# Patient Record
Sex: Male | Born: 1975
Health system: Southern US, Community
[De-identification: ages and names within clinical notes are randomized; demographics above are authoritative.]

## PROBLEM LIST (undated history)

## (undated) DIAGNOSIS — R29898 Other symptoms and signs involving the musculoskeletal system: Secondary | ICD-10-CM

## (undated) DIAGNOSIS — H538 Other visual disturbances: Secondary | ICD-10-CM

## (undated) HISTORY — PX: OTHER SURGICAL HISTORY: SHX169

## (undated) HISTORY — DX: Other visual disturbances: H53.8

## (undated) HISTORY — DX: Other symptoms and signs involving the musculoskeletal system: R29.898

---

## 2016-06-08 ENCOUNTER — Ambulatory Visit: Payer: 59 | Attending: Neurosurgery | Admitting: Physical Therapy

## 2016-06-08 ENCOUNTER — Encounter: Payer: Self-pay | Admitting: Physical Therapy

## 2016-06-08 DIAGNOSIS — M62838 Other muscle spasm: Secondary | ICD-10-CM | POA: Diagnosis present

## 2016-06-08 DIAGNOSIS — M6281 Muscle weakness (generalized): Secondary | ICD-10-CM | POA: Insufficient documentation

## 2016-06-08 DIAGNOSIS — M542 Cervicalgia: Secondary | ICD-10-CM | POA: Diagnosis present

## 2016-06-08 NOTE — Therapy (Signed)
[facility] [address] [place], [place], [address] Phone: [phone]   Fax:  [phone]  Physical Therapy Evaluation  Patient Details  Name: Alan Buck MRN: [id number] Date of Birth: 23-Apr-1976 Referring Provider: Dr. Maeola Harman  Encounter Date: 06/08/2016      PT End of Session - 06/08/16 0833    Visit Number 1   Date for PT Re-Evaluation 08/03/16   Authorization - Visit Number 1   Authorization - Number of Visits 8   PT Start Time 0800   PT Stop Time 0850   PT Time Calculation (min) 50 min   Activity Tolerance Patient tolerated treatment well   Behavior During Therapy [facility] for tasks assessed/performed      History reviewed. No pertinent past medical history.  History reviewed. No pertinent surgical history.  There were no vitals filed for this visit.       Subjective Assessment - 06/08/16 0804    Subjective Patient reports pain 1 year ago after a long trip from [place].  Tingling in hands and under feet. MD said it was due to his posture. Pain was off and on.  Patient was transferred to [facility] and still had problem.  Patient saw chiropractor and no changes.    Diagnostic tests MRI was negative   Patient Stated Goals reduce pain; educate on posture   Currently in Pain? Yes   Pain Score 4    Pain Location Neck  upper shoulders   Pain Orientation Right;Left;Mid   Pain Descriptors / Indicators Constant   Pain Type Chronic pain   Pain Radiating Towards down to shoulders   Pain Onset More than a month ago   Pain Frequency Constant   Aggravating Factors  driving; works in [place] and increases pain   Pain Relieving Factors movement; walking   Multiple Pain Sites No            [facility] PT Assessment - 06/08/16 0001      Assessment   Medical Diagnosis M54.12 Radiculopathy, cervical region   Referring Provider Dr. Maeola Harman   Onset Date/Surgical Date 06/08/16   Hand Dominance Right    Prior Therapy chiropractor     Precautions   Precautions None     Restrictions   Weight Bearing Restrictions No     Balance Screen   Has the patient fallen in the past 6 months No   Has the patient had a decrease in activity level because of a fear of falling?  No   Is the patient reluctant to leave their home because of a fear of falling?  No     Home [employer]ist information centre manager residence     Prior Function   Level of Independence Independent   Vocation Full time employment   Vocation Requirements sitting   Leisure walk 2 miles per day; yog     Cognition   Overall Cognitive Status Within Functional Limits for tasks assessed     Observation/Other Assessments   Focus on Therapeutic Outcomes (FOTO)  49% limitation  goal is 35% limitation     Posture/Postural Control   Posture/Postural Control Postural limitations   Postural Limitations Rounded Shoulders;Forward head;Flexed trunk     ROM / Strength   AROM / PROM / Strength AROM;Strength     AROM   Overall AROM Comments left rotation thoracic decreased by 25% with pain   AROM Assessment Site Cervical   Cervical Flexion 50   Cervical Extension  70   Cervical - Right Side Bend 40   Cervical - Left Side Bend 40   Cervical - Right Rotation 90   Cervical - Left Rotation 80     Strength   Overall Strength Comments bilateral shoulder horizontal abduction 4/5     Palpation   Spinal mobility decreased movement of C6-T4 vertebrae   Palpation comment tenderness located in bilateral cervical paraspinals, scalenes, upper trap     Special Tests    Special Tests Cervical   Cervical Tests Spurling's     Spurling's   Findings Positive   Side Left   Comment pain on right upper trap     Transfers   Transfers Not assessed     Ambulation/Gait   Ambulation/Gait No                           PT Education - 06/08/16 0831    Education provided Yes   Education Details Scalene stretch, scap  squeezes, red band horizontal abduction with core activation, diagonals, with red band.    Person(s) Educated Patient   Methods Explanation;Demonstration;Tactile cues;Verbal cues   Comprehension Verbalized understanding;Returned demonstration          PT Short Term Goals - 06/08/16 0830      PT SHORT TERM GOAL #1   Title independent with initial HEP including stretches and postural exercises   Time 8   Period Weeks   Status New     PT SHORT TERM GOAL #2   Title pain with driving and turning to the left decreased >/= 50% due to increased cervical and thoracic movement   Time 4   Period Weeks   Status New     PT SHORT TERM GOAL #3   Title sit for 1.5 hours with pain decreased >/= 50% due to improved postural awareness   Time 4   Period Weeks   Status New           PT Long Term Goals - 06/08/16 [id number]      PT LONG TERM GOAL #1   Title independent with HEP   Time 8   Period Weeks   Status New     PT LONG TERM GOAL #2   Title drive with turning head to the left without pain due to increased mobility   Time 8   Period Weeks   Status New     PT LONG TERM GOAL #3   Title sit for 1.5 hours with minimal to no discomfort due to improved postural awareness   Time 8   Period Weeks   Status New     PT LONG TERM GOAL #4   Title pain is minimal to none during daily activities intermittently   Time 8   Period Weeks   Status New     PT LONG TERM GOAL #5   Title FOTO score is </= 35% limitation   Time 8   Period Weeks   Status New               Plan - 06/08/16 0834    Clinical Impression Statement Patient is a 41 year old male with cervical radiculopathy for 1 year.  Pain began after a long flight from [place].  Patient reports constant cervical and upper trapezius pain at level 4/10.  Patient reports pain is worse with sitting and driving while turning his head to the left.  Cervical  and thoracic rotation to  the left decreased by 25% with pain.  Decreased  mobility of C6-T4 vertebrae.  Palpable tenderness located in cervical paraspinals and upper trapezius.  Patient has weak interscapular muscles. Patient is low complexity evaluation and no comorbidities that will affect impact of care.  Patient will benefit from skilled therapy to reduce pain and improve flexibilty.    Rehab Potential Excellent   Clinical Impairments Affecting Rehab Potential None   PT Frequency 1x / week   PT Duration 8 weeks   PT Treatment/Interventions Cryotherapy;Electrical Stimulation;Ultrasound;Traction;Moist Heat;Therapeutic activities;Therapeutic exercise;Neuromuscular re-education;Patient/family education;Passive range of motion;Manual techniques;Dry needling   PT Next Visit Plan soft tissue work, joint mobilization to C6-T4; postural muscle strength; soft tissue work to cervical paraspinals; dry needling to cervical paraspinals and upper trap   PT Home Exercise Plan postural education   Recommended Other Services None   Consulted and Agree with Plan of Care Patient      Patient will benefit from skilled therapeutic intervention in order to improve the following deficits and impairments:  Pain, Decreased strength, Decreased mobility, Decreased activity tolerance, Decreased range of motion  Visit Diagnosis: Muscle weakness (generalized) - Plan: PT plan of care cert/re-cert  Other muscle spasm - Plan: PT plan of care cert/re-cert  Cervicalgia - Plan: PT plan of care cert/re-cert     Problem List There are no active problems to display for this patient.   Eulis Foster, PT 06/08/16 9:19 AM   [facility] [address], [address] [place], [place], [address] Phone: [phone]   Fax:  [phone]  Name: Alan Buck MRN: [id number] Date of Birth: Feb 17, 1976

## 2016-06-08 NOTE — Patient Instructions (Signed)
AROM: Lateral Neck Flexion    Sitting position, hold onto your chair to keep shoulder blades SOFTLY down. Slowly tilt ear toward one shoulder, then the other. Hold each position __3-4__breaths. Repeat __1-3__ times per set. Do _multiple___ sessions per day.  http://orth.exer.us/296   Copyright  VHI. All rights reserved.    PNF Strengthening: Resisted   Standing with resistive band around each hand, bring right arm up and away, thumb back. Repeat _10___ times per set. Do _2___ sets per session. Do _1-2___ sessions per day.      Resisted Horizontal Abduction: Bilateral   Sit or stand, tubing in both hands, arms out in front. Keeping arms straight, pinch shoulder blades together and stretch arms out. Practice relaxing neck muscles and using the muscles between the shoulder blades to move arms.  Repeat _10___ times per set. Do 2____ sets per session. Do _1-2___ sessions per day.                  Scapular Retraction: Elbow Flexion (Standing)   With elbows bent to 90, pinch shoulder blades together and rotate arms out, keeping elbows bent. Repeat _5-10___ times per set. Do many____ sessions per day.

## 2016-06-14 ENCOUNTER — Ambulatory Visit: Payer: 59

## 2016-06-14 DIAGNOSIS — M6281 Muscle weakness (generalized): Secondary | ICD-10-CM | POA: Diagnosis not present

## 2016-06-14 DIAGNOSIS — M542 Cervicalgia: Secondary | ICD-10-CM

## 2016-06-14 DIAGNOSIS — M62838 Other muscle spasm: Secondary | ICD-10-CM

## 2016-06-14 NOTE — Therapy (Signed)
[facility] [address], [address] [place], [place], [address] Phone: [phone]   Fax:  [phone]  Physical Therapy Treatment  Patient Details  Name: Alan Buck MRN: [id number] Date of Birth: 11/12/1975 Referring Provider: Dr. Maeola Harman  Encounter Date: 06/14/2016      PT End of Session - 06/14/16 0806    Visit Number 2   Date for PT Re-Evaluation 08/03/16   Authorization - Visit Number 2   Authorization - Number of Visits 8   PT Start Time 0729   PT Stop Time 0802   PT Time Calculation (min) 33 min   Activity Tolerance Patient tolerated treatment well   Behavior During Therapy [facility] for tasks assessed/performed      History reviewed. No pertinent past medical history.  History reviewed. No pertinent surgical history.  There were no vitals filed for this visit.      Subjective Assessment - 06/14/16 0733    Subjective Pt doesn't want to try dry needling right now.  Pt has been doing exercises.     Diagnostic tests MRI was negative   Patient Stated Goals reduce pain; educate on posture   Currently in Pain? Yes   Pain Score 4    Pain Location Neck   Pain Orientation Right;Left   Pain Descriptors / Indicators Constant   Pain Type Chronic pain   Pain Onset More than a month ago   Pain Frequency Constant   Aggravating Factors  driving, sitting at desk   Pain Relieving Factors movement, walking                          [facility] Adult PT Treatment/Exercise - 06/14/16 0001      Exercises   Exercises Neck;Shoulder;Lumbar     Neck Exercises: Machines for Strengthening   UBE (Upper Arm Bike) Level 1x 6 minutes (3/3)  PT present to discuss progress     Neck Exercises: Supine   Upper Extremity D2 Flexion;Theraband;20 reps   Theraband Level (UE D2) Level 2 (Red)   Other Supine Exercise supine on foam roll for decompression: horizontal abduction with red band 2x10     Lumbar Exercises:  Seated   Other Seated Lumbar Exercises thoraco lumbar rotation: 3x20 seconds     Manual Therapy   Manual Therapy Joint mobilization   Manual therapy comments PA mobs T1-6 grade 2-3 with pt prone     Neck Exercises: Stretches   Upper Trapezius Stretch 3 reps;10 seconds                PT Education - 06/14/16 0806    Education provided Yes   Education Details neck rotation, thoracic rotation   Person(s) Educated Patient   Methods Explanation;Demonstration;Verbal cues   Comprehension Verbalized understanding;Returned demonstration          PT Short Term Goals - 06/14/16 0735      PT SHORT TERM GOAL #1   Title independent with initial HEP including stretches and postural exercises   Time 8   Period Weeks   Status On-going     PT SHORT TERM GOAL #2   Title pain with driving and turning to the left decreased >/= 50% due to increased cervical and thoracic movement   Time 4   Period Weeks   Status On-going     PT SHORT TERM GOAL #3   Title sit for 1.5 hours with pain decreased >/= 50% due to improved postural  awareness   Time 4   Period Weeks   Status On-going           PT Long Term Goals - 06/08/16 [id number]      PT LONG TERM GOAL #1   Title independent with HEP   Time 8   Period Weeks   Status New     PT LONG TERM GOAL #2   Title drive with turning head to the left without pain due to increased mobility   Time 8   Period Weeks   Status New     PT LONG TERM GOAL #3   Title sit for 1.5 hours with minimal to no discomfort due to improved postural awareness   Time 8   Period Weeks   Status New     PT LONG TERM GOAL #4   Title pain is minimal to none during daily activities intermittently   Time 8   Period Weeks   Status New     PT LONG TERM GOAL #5   Title FOTO score is </= 35% limitation   Time 8   Period Weeks   Status New               Plan - 06/14/16 [id number]    Clinical Impression Statement Pt with only one session after evaluation.   Pt has been working on his posture at work and has been advised to take mini breaks when sitting at desk for long periods.  PT focused on postural strength, thoracic and neck flexibility today.  Pt will continue to benefit from skilled PT for cervical flexibility, strength and postural training.     Rehab Potential Excellent   PT Frequency 1x / week   PT Duration 8 weeks   PT Treatment/Interventions Cryotherapy;Electrical Stimulation;Ultrasound;Traction;Moist Heat;Therapeutic activities;Therapeutic exercise;Neuromuscular re-education;Patient/family education;Passive range of motion;Manual techniques;Dry needling   PT Next Visit Plan soft tissue work, joint mobilization to C6-T4; postural muscle strength; soft tissue work to cervical paraspinals   Consulted and Agree with Plan of Care Patient      Patient will benefit from skilled therapeutic intervention in order to improve the following deficits and impairments:  Pain, Decreased strength, Decreased mobility, Decreased activity tolerance, Decreased range of motion  Visit Diagnosis: Muscle weakness (generalized)  Other muscle spasm  Cervicalgia     Problem List There are no active problems to display for this patient.    Lorrene ReidKelly Takacs, PT 06/14/16 8:08 AM  [facility] [address], [address] [place], [place], [address] Phone: [phone]   Fax:  [phone]  Name: Alan Buck MRN: [id number] Date of Birth: 05-14-1976

## 2016-06-14 NOTE — Patient Instructions (Addendum)
Cervico-Thoracic: Extension / Rotation (Sitting)    Reach across body with left arm and grasp back of chair. Gently look over right side shoulder. Hold __20__ seconds. Relax. Repeat _3___ times per set. Do _1___ sets per session. Do __3__ sessions per day.  http://orth.exer.us/980   Copyright  VHI. All rights reserved.   AROM: Neck Rotation    Turn head slowly to look over one shoulder, then the other. Hold each position __20__ seconds. Repeat __3__ times per set. Do _1___ sets per session. Do _3___ sessions per day.  http://orth.exer.us/294   Copyright  VHI. All rights reserved.   [facility] [address], [address] [place], [place] [address] Phone # [phone] Fax [phone]

## 2016-06-21 ENCOUNTER — Ambulatory Visit: Payer: 59 | Attending: Neurosurgery

## 2016-06-21 DIAGNOSIS — M542 Cervicalgia: Secondary | ICD-10-CM | POA: Insufficient documentation

## 2016-06-21 DIAGNOSIS — M62838 Other muscle spasm: Secondary | ICD-10-CM | POA: Insufficient documentation

## 2016-06-21 DIAGNOSIS — M6281 Muscle weakness (generalized): Secondary | ICD-10-CM | POA: Diagnosis present

## 2016-06-21 NOTE — Therapy (Signed)
[facility] [address] [place], [place], [address] Phone: [phone]   Fax:  [phone]  Physical Therapy Treatment  Patient Details  Name: Alan Buck MRN: [id number] Date of Birth: [birth year] Referring Provider: Dr. Maeola HarmanJoseph Stern  Encounter Date: 06/21/2016      PT End of Session - 06/21/16 0804    Visit Number 3   Date for PT Re-Evaluation 08/03/16   Authorization - Visit Number 3   Authorization - Number of Visits 8   PT Start Time 0731   PT Stop Time 0820   PT Time Calculation (min) 49 min   Activity Tolerance Patient tolerated treatment well   Behavior During Therapy [facility] for tasks assessed/performed      History reviewed. No pertinent past medical history.  History reviewed. No pertinent surgical history.  There were no vitals filed for this visit.      Subjective Assessment - 06/21/16 0732    Subjective No change in symptoms.  Pt has been doing exercises.     Patient Stated Goals reduce pain; educate on posture   Currently in Pain? Yes   Pain Score 4    Pain Location Neck   Pain Orientation Right;Left   Pain Descriptors / Indicators Constant   Pain Type Chronic pain   Pain Onset More than a month ago   Pain Frequency Constant   Aggravating Factors  driving, sitting at desk   Pain Relieving Factors movement, walking                         [facility] Adult PT Treatment/Exercise - 06/21/16 0001      Neck Exercises: Machines for Strengthening   UBE (Upper Arm Bike) Level 1x 6 minutes (3/3)  PT present to discuss progress     Neck Exercises: Supine   Upper Extremity D2 Flexion;Theraband;20 reps   Theraband Level (UE D2) Level 2 (Red)   Other Supine Exercise supine on foam roll for decompression: horizontal abduction with red band 2x10     Lumbar Exercises: Seated   Other Seated Lumbar Exercises thoraco lumbar rotation: 3x20 seconds     Shoulder Exercises: Supine   External Rotation Strengthening;Both;20 reps;Theraband   Theraband Level (Shoulder External Rotation) Level 2 (Red)  on foam roll     Shoulder Exercises: Standing   Other Standing Exercises snow angels on wall x10     Modalities   Modalities Traction     Traction   Type of Traction Cervical   Min (lbs) 5   Max (lbs) 15   Hold Time 60   Rest Time 10   Time 15 minutes     Manual Therapy   Manual Therapy Joint mobilization   Manual therapy comments PA mobs T1-6 grade 2-3 with pt prone                  PT Short Term Goals - 06/21/16 0734      PT SHORT TERM GOAL #1   Title independent with initial HEP including stretches and postural exercises   Status Achieved     PT SHORT TERM GOAL #2   Title pain with driving and turning to the left decreased >/= 50% due to increased cervical and thoracic movement   Time 4   Period Weeks   Status On-going     PT SHORT TERM GOAL #3   Title sit for 1.5 hours with pain decreased >/= 50% due to improved  postural awareness   Time 4   Period Weeks   Status On-going           PT Long Term Goals - 06/08/16 3875      PT LONG TERM GOAL #1   Title independent with HEP   Time 8   Period Weeks   Status New     PT LONG TERM GOAL #2   Title drive with turning head to the left without pain due to increased mobility   Time 8   Period Weeks   Status New     PT LONG TERM GOAL #3   Title sit for 1.5 hours with minimal to no discomfort due to improved postural awareness   Time 8   Period Weeks   Status New     PT LONG TERM GOAL #4   Title pain is minimal to none during daily activities intermittently   Time 8   Period Weeks   Status New     PT LONG TERM GOAL #5   Title FOTO score is </= 35% limitation   Time 8   Period Weeks   Status New               Plan - 06/21/16 6433    Clinical Impression Statement Pt reports minimal to no change in symptoms since the start of care.  Pt has been working on posture and  is taking mini breaks during long stretches at the desk at work.  PT continues to focus on postural strength, thoracic and neck flexibility.  Pt will continue to benefit from skilled PT for strength, flexiblity and manual.     Rehab Potential Excellent   PT Frequency 1x / week   PT Duration 8 weeks   PT Treatment/Interventions Cryotherapy;Electrical Stimulation;Ultrasound;Traction;Moist Heat;Therapeutic activities;Therapeutic exercise;Neuromuscular re-education;Patient/family education;Passive range of motion;Manual techniques;Dry needling   PT Next Visit Plan soft tissue work, joint mobilization to C6-T4; postural muscle strength; assess response to traction   Consulted and Agree with Plan of Care Patient      Patient will benefit from skilled therapeutic intervention in order to improve the following deficits and impairments:  Pain, Decreased strength, Decreased mobility, Decreased activity tolerance, Decreased range of motion  Visit Diagnosis: Muscle weakness (generalized)  Other muscle spasm  Cervicalgia     Problem List There are no active problems to display for this patient.   Lorrene Reid, PT 06/21/16 8:08 AM  [facility] [address] [place], [place], [address] Phone: [phone]   Fax:  [phone]  Name: Alan Buck MRN: [id number] Date of Birth: [birth year]

## 2016-06-28 ENCOUNTER — Ambulatory Visit: Payer: 59

## 2016-06-28 DIAGNOSIS — M62838 Other muscle spasm: Secondary | ICD-10-CM

## 2016-06-28 DIAGNOSIS — M542 Cervicalgia: Secondary | ICD-10-CM

## 2016-06-28 DIAGNOSIS — M6281 Muscle weakness (generalized): Secondary | ICD-10-CM

## 2016-06-28 NOTE — Therapy (Signed)
[facility] [address], [address] [place], [place], [address] Phone: [phone]   Fax:  [phone]  Physical Therapy Treatment  Patient Details  Name: Alan Buck MRN: [id number] Date of Birth: [birth year] Referring Provider: Dr. Maeola HarmanJoseph Stern  Encounter Date: 06/28/2016      PT End of Session - 06/28/16 0757    Visit Number 4   Date for PT Re-Evaluation 08/03/16   Authorization - Visit Number 4   Authorization - Number of Visits 8   PT Start Time 0729   PT Stop Time 0759   PT Time Calculation (min) 30 min   Activity Tolerance Patient tolerated treatment well   Behavior During Therapy [facility] for tasks assessed/performed      History reviewed. No pertinent past medical history.  History reviewed. No pertinent surgical history.  There were no vitals filed for this visit.      Subjective Assessment - 06/28/16 0733    Subjective I didn't like the traction.  I had an aching in my neck after that.     Patient Stated Goals reduce pain; educate on posture   Currently in Pain? Yes   Pain Score 3    Pain Location Neck   Pain Orientation Right;Left   Pain Descriptors / Indicators Constant;Aching   Pain Type Chronic pain   Pain Onset More than a month ago   Pain Frequency Constant   Aggravating Factors  end of the day, sitting at desk, driving   Pain Relieving Factors movement, walking                         [facility] Adult PT Treatment/Exercise - 06/28/16 0001      Neck Exercises: Machines for Strengthening   UBE (Upper Arm Bike) Level 1x 6 minutes (3/3)  PT present to discuss progress     Neck Exercises: Supine   Upper Extremity D2 Flexion;Theraband;20 reps   Theraband Level (UE D2) Level 3 (Green)   Other Supine Exercise supine on foam roll for decompression: horizontal abduction with green band 2x10     Shoulder Exercises: Supine   External Rotation Strengthening;Both;20 reps;Theraband   Theraband Level (Shoulder External Rotation) Level 2 (Red)  on foam roll     Shoulder Exercises: Seated   Flexion Strengthening;Both;20 reps;Weights  focus on posture   Flexion Weight (lbs) 2   Abduction Strengthening;Both;20 reps;Weights   ABduction Weight (lbs) 2   ABduction Limitations scaption 2# 2x10 bil.     Shoulder Exercises: Standing   Other Standing Exercises snow angels on wall x10     Manual Therapy   Manual Therapy Taping   Manual therapy comments "I" strip of kinesiotape over Rt and Lt Upper trap   Kinesiotex Inhibit Muscle                  PT Short Term Goals - 06/28/16 0734      PT SHORT TERM GOAL #1   Title independent with initial HEP including stretches and postural exercises     PT SHORT TERM GOAL #2   Title pain with driving and turning to the left decreased >/= 50% due to increased cervical and thoracic movement   Time 4   Period Weeks   Status On-going     PT SHORT TERM GOAL #3   Title sit for 1.5 hours with pain decreased >/= 50% due to improved postural awareness   Time 4   Period Weeks  Status On-going           PT Long Term Goals - 06/08/16 8119      PT LONG TERM GOAL #1   Title independent with HEP   Time 8   Period Weeks   Status New     PT LONG TERM GOAL #2   Title drive with turning head to the left without pain due to increased mobility   Time 8   Period Weeks   Status New     PT LONG TERM GOAL #3   Title sit for 1.5 hours with minimal to no discomfort due to improved postural awareness   Time 8   Period Weeks   Status New     PT LONG TERM GOAL #4   Title pain is minimal to none during daily activities intermittently   Time 8   Period Weeks   Status New     PT LONG TERM GOAL #5   Title FOTO score is </= 35% limitation   Time 8   Period Weeks   Status New               Plan - 06/28/16 0735    Clinical Impression Statement Pt has been working on posture at work and is taking mini breaks.  Pt  reports 3/10 neck pain today and up to 4/10 pain in the evenings.  Pt is independent in HEP for strength and flexiblity. Pt tolerated increased resistance with green band today.  Pt did not like traction last session and does not want to try.  Also not receptive to dry needling.  PT will continue to address pain with exercise and spinal mobilizations.     Rehab Potential Excellent   PT Frequency 1x / week   PT Duration 8 weeks   PT Treatment/Interventions Cryotherapy;Electrical Stimulation;Ultrasound;Traction;Moist Heat;Therapeutic activities;Therapeutic exercise;Neuromuscular re-education;Patient/family education;Passive range of motion;Manual techniques;Dry needling   PT Next Visit Plan soft tissue work, joint mobilization to C6-T4; postural muscle strength;  assess response to kinesiotaping to bil. upper traps   Consulted and Agree with Plan of Care Patient      Patient will benefit from skilled therapeutic intervention in order to improve the following deficits and impairments:  Pain, Decreased strength, Decreased mobility, Decreased activity tolerance, Decreased range of motion  Visit Diagnosis: Muscle weakness (generalized)  Other muscle spasm  Cervicalgia     Problem List There are no active problems to display for this patient.   Lorrene Reid, PT 06/28/16 7:59 AM  [facility] [address] [place], [place], [address] Phone: [phone]   Fax:  [phone]  Name: Alan Buck MRN: [id number] Date of Birth: February 02, 1976

## 2016-07-05 ENCOUNTER — Ambulatory Visit: Payer: 59

## 2016-07-05 DIAGNOSIS — M62838 Other muscle spasm: Secondary | ICD-10-CM

## 2016-07-05 DIAGNOSIS — M542 Cervicalgia: Secondary | ICD-10-CM

## 2016-07-05 DIAGNOSIS — M6281 Muscle weakness (generalized): Secondary | ICD-10-CM

## 2016-07-05 NOTE — Therapy (Signed)
[facility] [address], [address] [place], [place], [address] Phone: [phone]   Fax:  [phone]  Physical Therapy Treatment  Patient Details  Name: Alan Buck MRN: [id number] Date of Birth: January 11, 1976 Referring Provider: Dr. Maeola Harman  Encounter Date: 07/05/2016      PT End of Session - 07/05/16 0757    Visit Number 5   Date for PT Re-Evaluation 08/03/16   PT Start Time 0728   PT Stop Time 0802   PT Time Calculation (min) 34 min   Activity Tolerance Patient tolerated treatment well   Behavior During Therapy [facility] for tasks assessed/performed      History reviewed. No pertinent past medical history.  History reviewed. No pertinent surgical history.  There were no vitals filed for this visit.      Subjective Assessment - 07/05/16 0731    Subjective The tape helped to reduce the pain for a few days.     Diagnostic tests MRI was negative   Patient Stated Goals reduce pain; educate on posture   Currently in Pain? Yes   Pain Score 4    Pain Location Neck   Pain Orientation Right;Left   Pain Descriptors / Indicators Constant;Aching   Pain Type Chronic pain   Pain Onset More than a month ago   Pain Frequency Constant   Aggravating Factors  sitting at desk, end of the day, driving   Pain Relieving Factors movement, walking                         [facility] Adult PT Treatment/Exercise - 07/05/16 0001      Neck Exercises: Machines for Strengthening   UBE (Upper Arm Bike) Level 1x 6 minutes (3/3)  PT present to discuss progress     Neck Exercises: Supine   Upper Extremity D2 Flexion;Theraband;20 reps   Theraband Level (UE D2) Level 3 (Green)   Other Supine Exercise supine on foam roll for decompression: horizontal abduction with green band 2x10     Shoulder Exercises: Supine   External Rotation Strengthening;Both;20 reps;Theraband   Theraband Level (Shoulder External Rotation) Level 3  (Green)     Shoulder Exercises: Seated   Flexion Strengthening;Both;20 reps;Weights  focus on posture   Flexion Weight (lbs) 2   Abduction Strengthening;Both;20 reps;Weights   ABduction Weight (lbs) 2   ABduction Limitations scaption 2# 2x10 bil.     Shoulder Exercises: Standing   Other Standing Exercises snow angels on wall x10     Manual Therapy   Manual Therapy Taping   Manual therapy comments "I" strip of kinesiotape over Rt and Lt Upper trap   Kinesiotex Inhibit Muscle                  PT Short Term Goals - 06/28/16 0734      PT SHORT TERM GOAL #1   Title independent with initial HEP including stretches and postural exercises     PT SHORT TERM GOAL #2   Title pain with driving and turning to the left decreased >/= 50% due to increased cervical and thoracic movement   Time 4   Period Weeks   Status On-going     PT SHORT TERM GOAL #3   Title sit for 1.5 hours with pain decreased >/= 50% due to improved postural awareness   Time 4   Period Weeks   Status On-going           PT Long  Term Goals - 07/05/16 0736      PT LONG TERM GOAL #4   Title pain is minimal to none during daily activities intermittently   Time 8   Period Weeks   Status On-going     PT LONG TERM GOAL #5   Title FOTO score is </= 35% limitation   Baseline 41% limitation   Time 8   Period Weeks   Status On-going               Plan - 07/05/16 16100738    Clinical Impression Statement FOTO is improved to 41% limitation (49% at evaluation).  Pt with reduced neck pain with kinesiotape application last session.  Pt reports that his pain varies throughout the day.  He is making postural corrections at home.  Pt advanced to green band for strength at home.  Pt will continue to benefit from skilled PT for postural strength, flexiblity and taping for pain.     Rehab Potential Excellent   PT Frequency 1x / week   PT Duration 8 weeks   PT Treatment/Interventions Cryotherapy;Electrical  Stimulation;Ultrasound;Traction;Moist Heat;Therapeutic activities;Therapeutic exercise;Neuromuscular re-education;Patient/family education;Passive range of motion;Manual techniques;Dry needling   PT Next Visit Plan soft tissue work, joint mobilization to C6-T4; postural muscle strength;  kinesiotaping to bil. upper traps   Consulted and Agree with Plan of Care Patient      Patient will benefit from skilled therapeutic intervention in order to improve the following deficits and impairments:  Pain, Decreased strength, Decreased mobility, Decreased activity tolerance, Decreased range of motion  Visit Diagnosis: Muscle weakness (generalized)  Other muscle spasm  Cervicalgia     Problem List There are no active problems to display for this patient.    Lorrene ReidKelly Ambrie Carte, PT 07/05/16 8:01 AM  [facility] [address], [address] [place], [place], [address] Phone: [phone]   Fax:  [phone]  Name: Alan Buck MRN: [id number] Date of Birth: February 26, 1976

## 2016-07-12 ENCOUNTER — Ambulatory Visit: Payer: 59

## 2016-07-12 DIAGNOSIS — M6281 Muscle weakness (generalized): Secondary | ICD-10-CM

## 2016-07-12 DIAGNOSIS — M62838 Other muscle spasm: Secondary | ICD-10-CM

## 2016-07-12 DIAGNOSIS — M542 Cervicalgia: Secondary | ICD-10-CM

## 2016-07-12 NOTE — Therapy (Addendum)
[facility] [address] [place], [place], [address] Phone: [phone]   Fax:  [phone]  Physical Therapy Treatment  Patient Details  Name: Alan Buck MRN: [id number] Date of Birth: [birth year] Referring Provider: Dr. Erline Levine  Encounter Date: 07/12/2016      PT End of Session - 07/12/16 0810    Visit Number 6   Date for PT Re-Evaluation 08/03/16   PT Start Time 0730   PT Stop Time 0810   PT Time Calculation (min) 40 min   Activity Tolerance Patient tolerated treatment well   Behavior During Therapy [facility] for tasks assessed/performed      History reviewed. No pertinent past medical history.  History reviewed. No pertinent surgical history.  There were no vitals filed for this visit.      Subjective Assessment - 07/12/16 0735    Subjective The tape is helping.  I ordered some and it hasn't come yet.  Pt reports Rt sided LBP when sitting that he thinks might be contributing to neck pain.   Currently in Pain? Yes   Pain Score 3    Pain Location Neck   Pain Orientation Right;Left   Pain Descriptors / Indicators Constant;Aching   Pain Type Chronic pain   Pain Onset More than a month ago   Pain Frequency Constant   Aggravating Factors  sitting at desk, end of the day, driving   Pain Relieving Factors movement, walking                         [facility] Adult PT Treatment/Exercise - 07/12/16 0001      Neck Exercises: Machines for Strengthening   UBE (Upper Arm Bike) Level 2 x 6 minutes (3/3)  PT present to discuss progress     Lumbar Exercises: Stretches   Single Knee to Chest Stretch 3 reps;20 seconds   Lower Trunk Rotation 3 reps;20 seconds     Shoulder Exercises: Seated   Flexion Strengthening;Both;20 reps;Weights  focus on posture   Flexion Weight (lbs) 2   Abduction Strengthening;Both;20 reps;Weights   ABduction Weight (lbs) 2   ABduction Limitations scaption 2# 2x10  bil.     Shoulder Exercises: Standing   Other Standing Exercises snow angels on wall 2x10     Manual Therapy   Manual Therapy Joint mobilization;Taping   Manual therapy comments "I" strip of kinesiotape over Rt and Lt Upper trap   Joint Mobilization PA mobs T2-5 grade 3   Kinesiotex Inhibit Muscle     Neck Exercises: Stretches   Upper Trapezius Stretch 2 reps;20 seconds                PT Education - 07/12/16 0757    Education provided Yes   Education Details lumbar flexibility supine   Person(s) Educated Patient   Methods Explanation;Demonstration;Handout   Comprehension Verbalized understanding;Returned demonstration          PT Short Term Goals - 06/28/16 0734      PT SHORT TERM GOAL #1   Title independent with initial HEP including stretches and postural exercises     PT SHORT TERM GOAL #2   Title pain with driving and turning to the left decreased >/= 50% due to increased cervical and thoracic movement   Time 4   Period Weeks   Status On-going     PT SHORT TERM GOAL #3   Title sit for 1.5 hours with pain decreased >/= 50% due  to improved postural awareness   Time 4   Period Weeks   Status On-going           PT Long Term Goals - 07/12/16 0739      PT LONG TERM GOAL #2   Title drive with turning head to the left without pain due to increased mobility   Status Achieved     PT LONG TERM GOAL #3   Title sit for 1.5 hours with minimal to no discomfort due to improved postural awareness   Baseline 40-60 minutes   Period Weeks   Status On-going     PT LONG TERM GOAL #4   Title pain is minimal to none during daily activities intermittently   Time 8   Period Weeks   Status On-going     PT LONG TERM GOAL #5   Title FOTO score is </= 35% limitation   Baseline 41% limitation   Time 8   Period Weeks   Status On-going               Plan - 07/12/16 2633    Clinical Impression Statement Pt with reduced neck pain today and rates it 3/10.   Pt able to sit 40-60 minutes without being limited by pain.  Pt reports that he can turn Lt with driving with minimal to no increase in pain.  Pt with good pain relief with kinesiotape.  Pt with improved postural awareness and tolerance for postural strength exercises.  Pt will continue to benefit from skilled PT for flexiblity, postural strength and endurance, and manual/modalities for pain management.     Rehab Potential Excellent   PT Frequency 1x / week   PT Duration 8 weeks   PT Treatment/Interventions Cryotherapy;Electrical Stimulation;Ultrasound;Traction;Moist Heat;Therapeutic activities;Therapeutic exercise;Neuromuscular re-education;Patient/family education;Passive range of motion;Manual techniques;Dry needling   PT Next Visit Plan soft tissue work, joint mobilization to C6-T4; postural muscle strength;  kinesiotaping to bil. upper traps   Consulted and Agree with Plan of Care Patient      Patient will benefit from skilled therapeutic intervention in order to improve the following deficits and impairments:  Pain, Decreased strength, Decreased mobility, Decreased activity tolerance, Decreased range of motion  Visit Diagnosis: Muscle weakness (generalized)  Other muscle spasm  Cervicalgia     Problem List There are no active problems to display for this patient.    Sigurd Sos, PT 07/12/16 8:11 AM PHYSICAL THERAPY DISCHARGE SUMMARY  Visits from Start of Care: 6  Current functional level related to goals / functional outcomes: Pt went to see MD 07/18/16 and called to cancel all appointments as MD would like pt to have further testing.  Pt will D/C to HEP.   Remaining deficits: Pt with continued pain and numbness.  Pt will be further evaluated.     Education / Equipment: HEP, posture Plan: Patient agrees to discharge.  Patient goals were partially met. Patient is being discharged due to the physician's request.  ?????        Sigurd Sos, PT 07/19/16 8:31  AM  [facility] [address] [place], [place], [address] Phone: [phone]   Fax:  [phone]  Name: Alan Buck MRN: [id number] Date of Birth: 27-Mar-1976

## 2016-07-12 NOTE — Patient Instructions (Addendum)
Perform all exercises below:  Hold _20___ seconds. Repeat _3___ times.  Do __3__ sessions per day. CAUTION: Movement should be gentle, steady and slow.  Knee to Chest  Lying supine, bend involved knee to chest. Perform with each leg.  Copyright  VHI. All rights reserved.  Double Knee to Chest (Flexion)   Gently pull both knees toward chest. Feel stretch in lower back or buttock area. Breathing deeply, Lumbar Rotation: Caudal - Bilateral (Supine)  Feet and knees together, arms outstretched, rotate knees left, turning head in opposite direction, until stretch is felt.     [facility] [address], [address] [place], [place] [address] Phone # [phone] Fax [phone] 

## 2016-07-27 ENCOUNTER — Encounter: Payer: Self-pay | Admitting: Neurology

## 2016-09-26 ENCOUNTER — Ambulatory Visit (INDEPENDENT_AMBULATORY_CARE_PROVIDER_SITE_OTHER): Payer: 59 | Admitting: Neurology

## 2016-09-26 ENCOUNTER — Encounter: Payer: Self-pay | Admitting: Neurology

## 2016-09-26 VITALS — BP 140/86 | HR 74 | Ht 70.0 in | Wt 187.2 lb

## 2016-09-26 DIAGNOSIS — R202 Paresthesia of skin: Secondary | ICD-10-CM | POA: Diagnosis not present

## 2016-09-26 DIAGNOSIS — M542 Cervicalgia: Secondary | ICD-10-CM | POA: Insufficient documentation

## 2016-09-26 DIAGNOSIS — E538 Deficiency of other specified B group vitamins: Secondary | ICD-10-CM | POA: Diagnosis not present

## 2016-09-26 MED ORDER — CYANOCOBALAMIN 1000 MCG/ML IJ SOLN
1000.0000 ug | Freq: Once | INTRAMUSCULAR | Status: AC
  Administered 2016-09-26: 1000 ug via INTRAMUSCULAR

## 2016-09-26 MED ORDER — "SYRINGE 25G X 1"" 3 ML MISC": 1.0000 | Freq: Once | 0 refills | Status: DC

## 2016-09-26 MED ORDER — "SYRINGE 25G X 1"" 3 ML MISC": 1.0000 | Freq: Once | 0 refills | Status: AC

## 2016-09-26 MED ORDER — CYANOCOBALAMIN 1000 MCG/ML IJ SOLN: 1000.0000 ug | INTRAMUSCULAR | 0 refills | Status: AC

## 2016-09-26 NOTE — Patient Instructions (Addendum)
Start Vitamin B12 IM injection daily x 7 days, weekly x 4 weeks, then monthly thereafter x 1 year.  Continue neck range of motion exercises  Recommend deep tissue massage for your neck pain  If your symptoms do not improve within 6-8 weeks, we may need to do additional testing   Return to clinic in 3-4 months

## 2016-09-26 NOTE — Progress Notes (Signed)
[facility] Clinic Note - Initial Visit   Date: 09/26/16  Alan HoehnSachin Lavallie MRN: [id number] DOB: 19-Sep-1975   Dear Dr. Venetia MaxonStern:  Thank you for your kind referral of Amiere Risdon for consultation of bilateral hand numbness. Although his history is well known to you, please allow us to reiterate it for the purpose of our medical record. The patient was accompanied to the clinic by self.  History of Present Illness: Alan Buck is a 41 y.o. right-handed [place] male with no prior medical history presenting for evaluation of bilateral arm paresthesias.    Starting around 2016, he began having burning and tingling at the soles of the feet.  He also has neck pain around the same time. There was no associated weakness.  He has several other complaints such as joint pain and right leg discomfort.  He first saw Sports Medicine who suggested treating neck pain with NSAIDs while living in [place], [place]  [place] [facility] [place] [place] and began seeing a chiropractor which did not help.  Most recently, he sought the opinion Dr. Venetia MaxonStern and started neck PT, which has helped some, but did not completely alleviate symptoms.  He also started having numbness/tingling of the hands and feet and was referred to see me.  His prior work-up has included NCS/EMG of the arms and legs and MRI cervical spine which was essentially normal.  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  He is not vegan or vegetarian.  Out-side paper records, electronic medical record, and images have been reviewed where available and summarized as:  07/22/2016:  Vitamin B12 191, CMP normal  NCS/EMG of the upper extremities 01/11/2016:  Mild left ulnar neuropathy across the elbow NCS/EMG of the lower extremities 03/07/2016:  Normal   Past Medical History:  Diagnosis Date  . Arm weakness   . Blurred vision     Past Surgical History:  Procedure Laterality Date   . none       Medications:  Outpatient Encounter Prescriptions as of 09/26/2016  Medication Sig  . ibuprofen (ADVIL,MOTRIN) 200 MG tablet Take 200 mg by mouth every 6 (six) hours as needed.  . cyanocobalamin (,VITAMIN B-12,) 1000 MCG/ML injection Inject 1 mL (1,000 mcg total) into the muscle every 30 (thirty) days. Inject 1 ml IM once a day x 7 days then once a week x 4 weeks then once a month x 1 year  . Syringe/Needle, Disp, (SYRINGE 3CC/25GX1") 25G X 1" 3 ML MISC 1 Package by Does not apply route once.  . [DISCONTINUED] Syringe/Needle, Disp, (SYRINGE 3CC/25GX1") 25G X 1" 3 ML MISC 1 Package by Does not apply route once.  . [EXPIRED] cyanocobalamin ((VITAMIN B-12)) injection 1,000 mcg    No facility-administered encounter medications on file as of 09/26/2016.      Allergies: Not on File  Family History: Family History  Problem Relation Age of Onset  . Throat cancer Father     Social History: Social History  Substance Use Topics  . Smoking status: Never Smoker  . Smokeless tobacco: Never Used  . Alcohol use No   Social History   Social History Narrative   Lives with wife and child in an apartment on the first floor.  Works as an Art gallery managerengineer.  Education: college.  Physiological scientistAerospace engineer.   Review of Systems:  CONSTITUTIONAL: No fevers, chills, night sweats, or weight loss.   EYES: +visual changes or eye pain ENT: No  hearing changes.  No history of nose bleeds.   RESPIRATORY: No cough, wheezing and shortness of breath.   CARDIOVASCULAR: Negative for chest pain, and palpitations.   GI: Negative for abdominal discomfort, blood in stools or black stools.  No recent change in bowel habits.   GU:  No history of incontinence.   MUSCLOSKELETAL: No history of joint pain or swelling.  No myalgias.   SKIN: Negative for lesions, rash, and itching.   HEMATOLOGY/ONCOLOGY: Negative for prolonged bleeding, bruising easily, and swollen nodes.  No history of cancer.   ENDOCRINE: Negative for  cold or heat intolerance, polydipsia or goiter.   PSYCH:  No depression or anxiety symptoms.   NEURO: As Above.   Vital Signs:  BP 140/86   Pulse 74   Ht 5\' 10"  (1.778 m)   Wt 187 lb 4 oz (84.9 kg)   SpO2 99%   BMI 26.87 kg/m  Pain Scale: 0 on a scale of 0-10   General Medical Exam:   General:  Well appearing, comfortable.   Eyes/ENT: see cranial nerve examination.   Neck: No masses appreciated.  There is increased tension of cervical muscles to palpation.  Neck ROM is grossly intact and full, mild restriction with left neck rotation. No carotid bruits. Respiratory:  Clear to auscultation, good air entry bilaterally.   Cardiac:  Regular rate and rhythm, no murmur.   Extremities:  No deformities, edema, or skin discoloration.  Skin:  No rashes or lesions.  Neurological Exam: MENTAL STATUS including orientation to time, place, person, recent and remote memory, attention span and concentration, language, and fund of knowledge is normal.  Speech is not dysarthric.  CRANIAL NERVES: II:  No visual field defects.  Unremarkable fundi.   III-IV-VI: Pupils equal round and reactive to light.  Normal conjugate, extra-ocular eye movements in all directions of gaze.  No nystagmus.  No ptosis.   V:  Normal facial sensation.   VII:  Normal facial symmetry and movements.  VIII:  Normal hearing and vestibular function.   IX-X:  Normal palatal movement.   XI:  Normal shoulder shrug and head rotation.   XII:  Normal tongue strength and range of motion, no deviation or fasciculation.  MOTOR: Motor strength is 5/5 throughout.  No atrophy, fasciculations or abnormal movements.  No pronator drift.  Tone is normal.    MSRs:   Right                                                                 Left brachioradialis 2+  brachioradialis 2+  biceps 2+  biceps 2+  triceps 2+  triceps 2+  patellar 2+  patellar 2+  ankle jerk 2+  ankle jerk 2+  Hoffman no  Hoffman no  plantar response down  plantar  response down   SENSORY:  Normal and symmetric perception of light touch, pinprick, vibration, and proprioception.  Romberg's sign absent.   COORDINATION/GAIT: Normal finger-to- nose-finger and heel-to-shin.  Intact rapid alternating movements bilaterally.  Able to rise from a chair without using arms.  Gait narrow based and stable. Tandem and stressed gait intact.    IMPRESSION: 1.  Paresthesias of the hands and feet, most likely due to vitamin B12 deficiency.   Reassure the patient that I  do not see anything worrisome on his exam.  He has had NCS/EMG performed elsewhere which was essentially normal.  If symptoms do not improve with vitamin B12 supplementation, may consider repeat NCS/EMG of the arms and/or MRI brain (exclude demyelinating disease).  Start vitamin B12 IM injection daily x 7 days, weekly x 4 weeks, then monthly thereafter x 1 year.  2.  Chronic cervicalgia due to muscle tension.  Encouraged him to continue ROM exercises and to consider seeing a massage therapist to help alleviate neck muscle spasm.  He has previously tried muscle relaxants with no significant benefit.  Explained that with his desk job at a computer, he needs to take breaks frequently to stretch.    Return to clinic in 3-4 months.   The duration of this appointment visit was 45 minutes of face-to-face time with the patient.  Greater than 50% of this time was spent in counseling, explanation of diagnosis, planning of further management, and coordination of care.   Thank you for allowing me to participate in patient's care.  If I can answer any additional questions, I would be pleased to do so.    Sincerely,    Madisynn Plair K. Allena Katz, DO

## 2017-01-27 ENCOUNTER — Ambulatory Visit: Payer: 59 | Admitting: Neurology

## 2017-01-30 ENCOUNTER — Ambulatory Visit: Payer: 59 | Admitting: Neurology

## 2017-01-30 DIAGNOSIS — Z029 Encounter for administrative examinations, unspecified: Secondary | ICD-10-CM

## 2017-02-06 ENCOUNTER — Encounter: Payer: Self-pay | Admitting: Neurology

## 2017-04-26 ENCOUNTER — Ambulatory Visit: Payer: 59 | Admitting: Neurology

## 2017-05-22 ENCOUNTER — Ambulatory Visit (INDEPENDENT_AMBULATORY_CARE_PROVIDER_SITE_OTHER): Payer: 59 | Admitting: Neurology

## 2017-05-22 ENCOUNTER — Encounter: Payer: Self-pay | Admitting: Neurology

## 2017-05-22 VITALS — BP 130/84 | HR 84 | Ht 70.0 in | Wt 188.4 lb

## 2017-05-22 DIAGNOSIS — R202 Paresthesia of skin: Secondary | ICD-10-CM | POA: Diagnosis not present

## 2017-05-22 DIAGNOSIS — E538 Deficiency of other specified B group vitamins: Secondary | ICD-10-CM

## 2017-05-22 NOTE — Progress Notes (Signed)
Follow-up Visit   Date: 05/22/17    Alan HoehnSachin Knapke MRN: [id number] DOB: December 10, 1975   Interim History: Alan Buck is a 42 y.o. right-handed [place] male returning to the clinic for follow-up of bilateral arm paresthesias in the setting of vitamin B12 deficiency.  The patient was accompanied to the clinic by self.  History of present illness: Starting around 2016, he began having burning and tingling at the soles of the feet.  He also has neck pain around the same time. There was no associated weakness.  He has several other complaints such as joint pain and right leg discomfort.  He first saw Sports Medicine who suggested treating neck pain with NSAIDs while living in [place], [place].  [place] [facility] [place] in December [place] and began seeing a chiropractor which did not help.  Most recently, he sought the opinion Dr. Venetia MaxonStern and started neck PT, which has helped some, but did not completely alleviate symptoms.  He also started having numbness/tingling of the hands and feet and was referred to see me.  His prior work-up has included NCS/EMG of the arms and legs and MRI cervical spine which was essentially normal.  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  He is not vegan or vegetarian.  UPDATE 05/22/2016:   He is here for follow-up visit.  He is doing much better since completing about 14 injections for vitamin B12 deficiency.  Now he has occasion burning sensation of the feet especially with prolonged walking.   He is treating is neck pain with exercises and rarely treats his neck pain with NSAIDs.    Medications:  Current Outpatient Medications on File Prior to Visit  Medication Sig Dispense Refill  . cyanocobalamin (,VITAMIN B-12,) 1000 MCG/ML injection Inject 1 mL (1,000 mcg total) into the muscle every 30 (thirty) days. Inject 1 ml IM once a day x 7 days then once a week x 4 weeks then once a month x 1 year 25 mL 0  . ibuprofen (ADVIL,MOTRIN)  200 MG tablet Take 200 mg by mouth every 6 (six) hours as needed.     No current facility-administered medications on file prior to visit.     Allergies: Not on File  Review of Systems:  CONSTITUTIONAL: No fevers, chills, night sweats, or weight loss.  EYES: No visual changes or eye pain ENT: No hearing changes.  No history of nose bleeds.   RESPIRATORY: No cough, wheezing and shortness of breath.   CARDIOVASCULAR: Negative for chest pain, and palpitations.   GI: Negative for abdominal discomfort, blood in stools or black stools.  No recent change in bowel habits.   GU:  No history of incontinence.   MUSCLOSKELETAL: No history of joint pain or swelling.  No myalgias.   SKIN: Negative for lesions, rash, and itching.   ENDOCRINE: Negative for cold or heat intolerance, polydipsia or goiter.   PSYCH:  No depression or anxiety symptoms.   NEURO: As Above.   Vital Signs:  BP 130/84   Pulse 84   Ht 5\' 10"  (1.778 m)   Wt 188 lb 6 oz (85.4 kg)   SpO2 99%   BMI 27.03 kg/m    General: Well-appearing, comfortable  Neurological Exam: MENTAL STATUS including orientation to time, place, person, recent and remote memory, attention span and concentration, language, and fund of knowledge is normal.  Speech is not dysarthric.  CRANIAL NERVES: No visual field defects.  Pupils  equal round and reactive to light.  Normal conjugate, extra-ocular eye movements in all directions of gaze.  No ptosis.   Face is symmetric. Palate elevates symmetrically.  Tongue is midline.  MOTOR:  Motor strength is 5/5 in all extremities.  No atrophy, fasciculations or abnormal movements.  No pronator drift.  Tone is normal.    MSRs:  Reflexes are 2+/4 throughout.  SENSORY:  Intact to vibration, temperature, and pin prick.  COORDINATION/GAIT:  Normal finger-to- nose-finger and heel-to-shin.  Intact rapid alternating movements bilaterally.  Gait narrow based and stable.   Data: 07/22/2016:  Vitamin B12 191, CMP  normal  NCS/EMG of the upper extremities 01/11/2016:  Mild left ulnar neuropathy across the elbow NCS/EMG of the lower extremities 03/07/2016:  Normal   IMPRESSION/PLAN: 1.  Paresthesias of the hands and feet due to vitamin B12 deficiency. Clinically doing much better with only sporadic and transient paresthesias of the feet now.  Recommend that he transition to OTC vitamin B12 daily.  Recheck level in 6 months.   2. Chronic cervicalgia, improved.  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  Return to clinic as needed  Thank you for allowing me to participate in patient's care.  If I can answer any additional questions, I would be pleased to do so.    Sincerely,    Gaylen Venning K. Allena Katz, DO

## 2017-05-22 NOTE — Patient Instructions (Signed)
Start vitamin B12 daily   Continue to do your neck exercises  Return to clinic as needed

## 2020-05-27 DIAGNOSIS — M25561 Pain in right knee: Secondary | ICD-10-CM | POA: Diagnosis not present

## 2020-05-27 DIAGNOSIS — M222X1 Patellofemoral disorders, right knee: Secondary | ICD-10-CM | POA: Diagnosis not present

## 2020-07-03 DIAGNOSIS — Z125 Encounter for screening for malignant neoplasm of prostate: Secondary | ICD-10-CM | POA: Diagnosis not present

## 2020-07-03 DIAGNOSIS — Z131 Encounter for screening for diabetes mellitus: Secondary | ICD-10-CM | POA: Diagnosis not present

## 2020-07-03 DIAGNOSIS — Z0000 Encounter for general adult medical examination without abnormal findings: Secondary | ICD-10-CM | POA: Diagnosis not present

## 2020-07-03 DIAGNOSIS — Z79899 Other long term (current) drug therapy: Secondary | ICD-10-CM | POA: Diagnosis not present

## 2020-07-03 DIAGNOSIS — Z13220 Encounter for screening for lipoid disorders: Secondary | ICD-10-CM | POA: Diagnosis not present

## 2020-09-18 DIAGNOSIS — M25561 Pain in right knee: Secondary | ICD-10-CM | POA: Diagnosis not present

## 2020-09-22 DIAGNOSIS — M79661 Pain in right lower leg: Secondary | ICD-10-CM | POA: Diagnosis not present

## 2020-09-22 DIAGNOSIS — M5459 Other low back pain: Secondary | ICD-10-CM | POA: Diagnosis not present

## 2020-09-22 DIAGNOSIS — M222X1 Patellofemoral disorders, right knee: Secondary | ICD-10-CM | POA: Diagnosis not present

## 2020-10-16 DIAGNOSIS — M545 Low back pain, unspecified: Secondary | ICD-10-CM | POA: Diagnosis not present

## 2020-10-23 DIAGNOSIS — M222X1 Patellofemoral disorders, right knee: Secondary | ICD-10-CM | POA: Diagnosis not present

## 2020-10-23 DIAGNOSIS — M5416 Radiculopathy, lumbar region: Secondary | ICD-10-CM | POA: Diagnosis not present

## 2020-10-28 DIAGNOSIS — M25561 Pain in right knee: Secondary | ICD-10-CM | POA: Diagnosis not present

## 2020-10-28 DIAGNOSIS — M5416 Radiculopathy, lumbar region: Secondary | ICD-10-CM | POA: Diagnosis not present

## 2020-11-06 DIAGNOSIS — M5416 Radiculopathy, lumbar region: Secondary | ICD-10-CM | POA: Diagnosis not present

## 2020-11-06 DIAGNOSIS — M25561 Pain in right knee: Secondary | ICD-10-CM | POA: Diagnosis not present

## 2020-11-09 DIAGNOSIS — M25561 Pain in right knee: Secondary | ICD-10-CM | POA: Diagnosis not present

## 2020-11-09 DIAGNOSIS — M5416 Radiculopathy, lumbar region: Secondary | ICD-10-CM | POA: Diagnosis not present

## 2020-11-12 DIAGNOSIS — M25561 Pain in right knee: Secondary | ICD-10-CM | POA: Diagnosis not present

## 2020-11-12 DIAGNOSIS — M5416 Radiculopathy, lumbar region: Secondary | ICD-10-CM | POA: Diagnosis not present

## 2020-11-17 DIAGNOSIS — M5416 Radiculopathy, lumbar region: Secondary | ICD-10-CM | POA: Diagnosis not present

## 2020-11-24 DIAGNOSIS — M25561 Pain in right knee: Secondary | ICD-10-CM | POA: Diagnosis not present

## 2020-11-24 DIAGNOSIS — M5416 Radiculopathy, lumbar region: Secondary | ICD-10-CM | POA: Diagnosis not present

## 2020-11-26 DIAGNOSIS — M25561 Pain in right knee: Secondary | ICD-10-CM | POA: Diagnosis not present

## 2020-11-26 DIAGNOSIS — M5416 Radiculopathy, lumbar region: Secondary | ICD-10-CM | POA: Diagnosis not present

## 2020-12-04 DIAGNOSIS — M5416 Radiculopathy, lumbar region: Secondary | ICD-10-CM | POA: Diagnosis not present

## 2020-12-04 DIAGNOSIS — M222X1 Patellofemoral disorders, right knee: Secondary | ICD-10-CM | POA: Diagnosis not present

## 2020-12-11 DIAGNOSIS — H11433 Conjunctival hyperemia, bilateral: Secondary | ICD-10-CM | POA: Diagnosis not present

## 2020-12-11 DIAGNOSIS — H40023 Open angle with borderline findings, high risk, bilateral: Secondary | ICD-10-CM | POA: Diagnosis not present

## 2020-12-11 DIAGNOSIS — H04123 Dry eye syndrome of bilateral lacrimal glands: Secondary | ICD-10-CM | POA: Diagnosis not present

## 2020-12-11 DIAGNOSIS — H2513 Age-related nuclear cataract, bilateral: Secondary | ICD-10-CM | POA: Diagnosis not present

## 2021-01-08 DIAGNOSIS — M5416 Radiculopathy, lumbar region: Secondary | ICD-10-CM | POA: Diagnosis not present

## 2021-01-13 DIAGNOSIS — J069 Acute upper respiratory infection, unspecified: Secondary | ICD-10-CM | POA: Diagnosis not present

## 2021-02-15 DIAGNOSIS — M25661 Stiffness of right knee, not elsewhere classified: Secondary | ICD-10-CM | POA: Diagnosis not present

## 2021-02-24 DIAGNOSIS — M5416 Radiculopathy, lumbar region: Secondary | ICD-10-CM | POA: Diagnosis not present

## 2021-03-01 DIAGNOSIS — M5416 Radiculopathy, lumbar region: Secondary | ICD-10-CM | POA: Diagnosis not present

## 2021-03-01 DIAGNOSIS — M25561 Pain in right knee: Secondary | ICD-10-CM | POA: Diagnosis not present

## 2021-06-25 DIAGNOSIS — H04123 Dry eye syndrome of bilateral lacrimal glands: Secondary | ICD-10-CM | POA: Diagnosis not present

## 2021-06-25 DIAGNOSIS — H40023 Open angle with borderline findings, high risk, bilateral: Secondary | ICD-10-CM | POA: Diagnosis not present

## 2021-06-25 DIAGNOSIS — H11433 Conjunctival hyperemia, bilateral: Secondary | ICD-10-CM | POA: Diagnosis not present

## 2021-07-19 DIAGNOSIS — Z125 Encounter for screening for malignant neoplasm of prostate: Secondary | ICD-10-CM | POA: Diagnosis not present

## 2021-07-19 DIAGNOSIS — R7309 Other abnormal glucose: Secondary | ICD-10-CM | POA: Diagnosis not present

## 2021-07-19 DIAGNOSIS — Z0000 Encounter for general adult medical examination without abnormal findings: Secondary | ICD-10-CM | POA: Diagnosis not present

## 2021-07-19 DIAGNOSIS — E7800 Pure hypercholesterolemia, unspecified: Secondary | ICD-10-CM | POA: Diagnosis not present

## 2021-07-19 DIAGNOSIS — R202 Paresthesia of skin: Secondary | ICD-10-CM | POA: Diagnosis not present

## 2021-08-16 ENCOUNTER — Other Ambulatory Visit (HOSPITAL_COMMUNITY): Payer: Self-pay | Admitting: Family Medicine

## 2021-08-16 DIAGNOSIS — E7800 Pure hypercholesterolemia, unspecified: Secondary | ICD-10-CM

## 2021-08-31 ENCOUNTER — Ambulatory Visit (HOSPITAL_COMMUNITY)
Admission: RE | Admit: 2021-08-31 | Discharge: 2021-08-31 | Disposition: A | Discharge status: Home / Self Care | Payer: Self-pay | Source: Ambulatory Visit | Attending: Family Medicine | Admitting: Family Medicine

## 2021-08-31 DIAGNOSIS — E7800 Pure hypercholesterolemia, unspecified: Secondary | ICD-10-CM | POA: Insufficient documentation

## 2021-09-13 DIAGNOSIS — K648 Other hemorrhoids: Secondary | ICD-10-CM | POA: Diagnosis not present

## 2021-09-13 DIAGNOSIS — Z1211 Encounter for screening for malignant neoplasm of colon: Secondary | ICD-10-CM | POA: Diagnosis not present

## 2021-09-13 DIAGNOSIS — K5730 Diverticulosis of large intestine without perforation or abscess without bleeding: Secondary | ICD-10-CM | POA: Diagnosis not present

## 2022-05-05 DIAGNOSIS — R051 Acute cough: Secondary | ICD-10-CM | POA: Diagnosis not present

## 2022-05-05 DIAGNOSIS — Z03818 Encounter for observation for suspected exposure to other biological agents ruled out: Secondary | ICD-10-CM | POA: Diagnosis not present

## 2022-05-05 DIAGNOSIS — R030 Elevated blood-pressure reading, without diagnosis of hypertension: Secondary | ICD-10-CM | POA: Diagnosis not present

## 2022-05-31 DIAGNOSIS — U071 COVID-19: Secondary | ICD-10-CM | POA: Diagnosis not present

## 2022-05-31 DIAGNOSIS — R5383 Other fatigue: Secondary | ICD-10-CM | POA: Diagnosis not present

## 2022-07-25 DIAGNOSIS — R7303 Prediabetes: Secondary | ICD-10-CM | POA: Diagnosis not present

## 2022-07-25 DIAGNOSIS — Z125 Encounter for screening for malignant neoplasm of prostate: Secondary | ICD-10-CM | POA: Diagnosis not present

## 2022-07-25 DIAGNOSIS — Z0000 Encounter for general adult medical examination without abnormal findings: Secondary | ICD-10-CM | POA: Diagnosis not present

## 2022-07-25 DIAGNOSIS — Z79899 Other long term (current) drug therapy: Secondary | ICD-10-CM | POA: Diagnosis not present

## 2022-07-25 DIAGNOSIS — E7800 Pure hypercholesterolemia, unspecified: Secondary | ICD-10-CM | POA: Diagnosis not present

## 2022-10-17 IMAGING — CT CT CARDIAC CORONARY ARTERY CALCIUM SCORE
3 series · 14 of 20 positions shown, 15 images · non-contrast
Comparison: None.
COMPARISON: None.

Addendum:
EXAM:
OVER-READ INTERPRETATION  CT CHEST

The following report is an over-read performed by radiologist Dr.
Zeinab Tiger [REDACTED] on 08/31/2021. This over-read
does not include interpretation of cardiac or coronary anatomy or
pathology. The coronary calcium score interpretation by the
cardiologist is attached.
TECHNIQUE: A gated, non-contrast computed tomography scan of the heart was
performed using 3mm slice thickness. Axial images were analyzed on a
dedicated workstation. Calcium scoring of the coronary arteries was
performed using the Agatston method.

[Series 3: ax ca scr 70% (id) · axial · 0.39mm/px · z∈[+1213,+1293]mm · 6 of 56 slices shown]
[im 8/56  vessel]
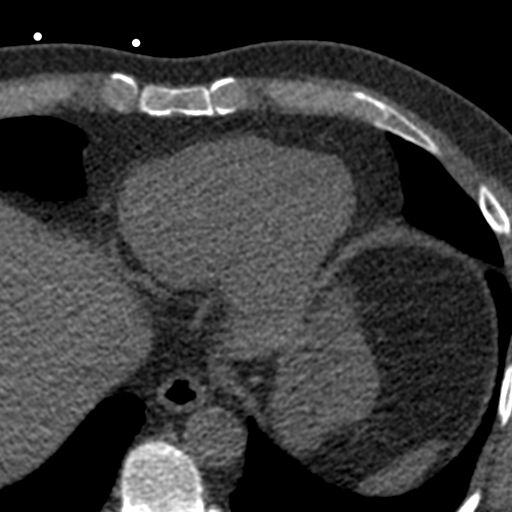
[im 16/56  vessel]
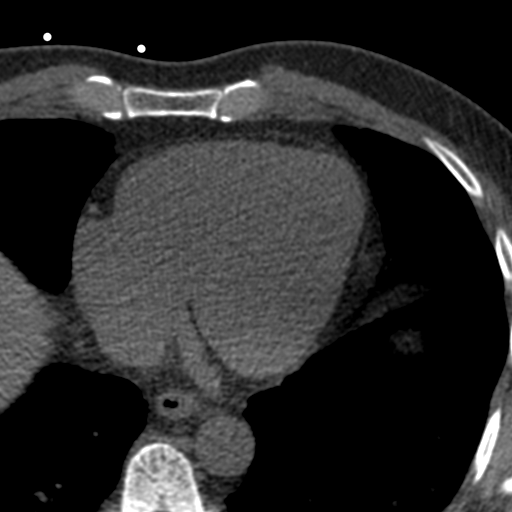
[im 24/56  vessel]
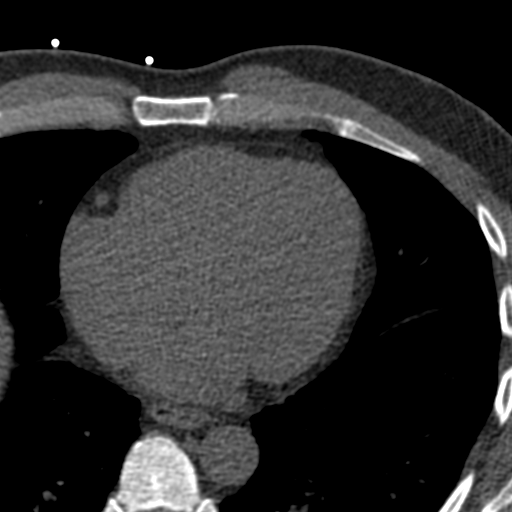
[im 32/56  vessel]
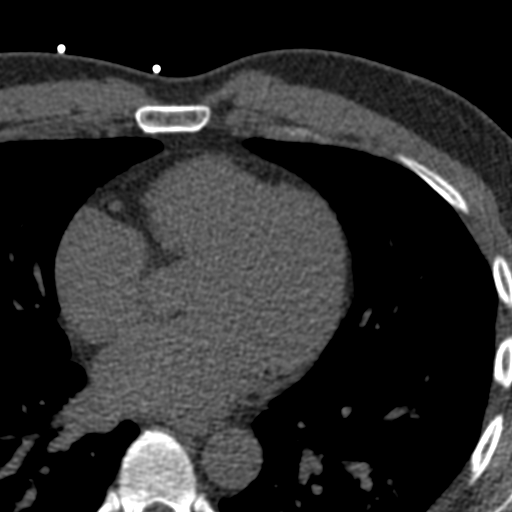
[im 40/56  vessel]
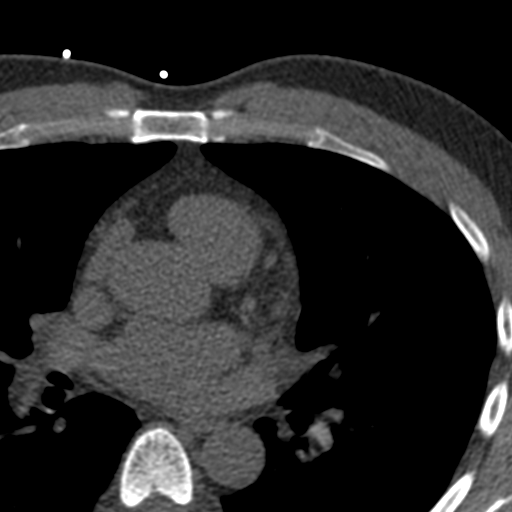
[im 48/56  vessel]
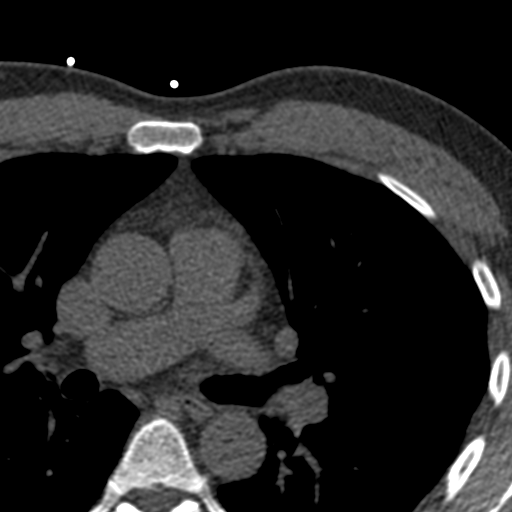

[Series 4: ax st · axial · 0.71mm/px · z∈[+1222,+1284]mm · 4 of 37 slices shown, 5 images]
[im 8/37  vessel]
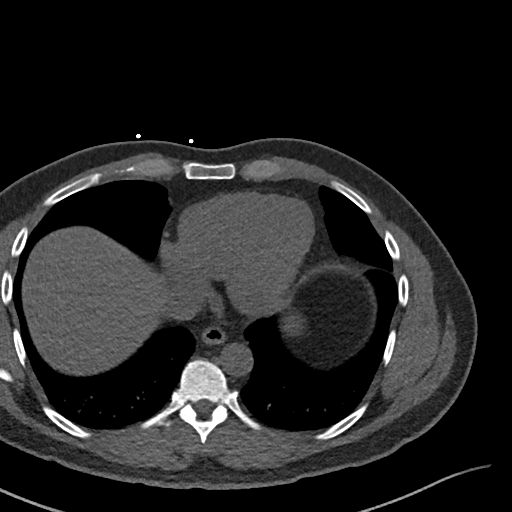
[im 8/37  lung]
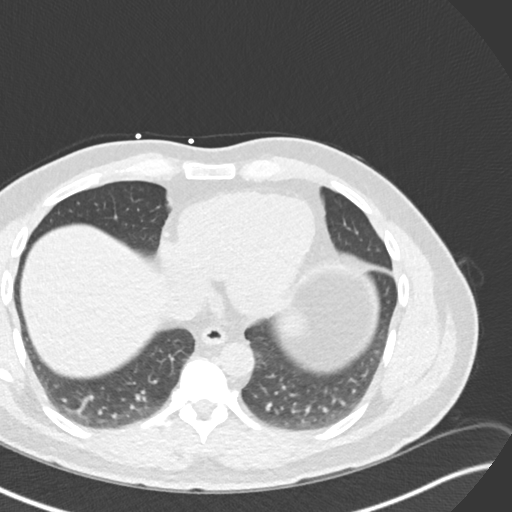
[im 15/37  vessel]
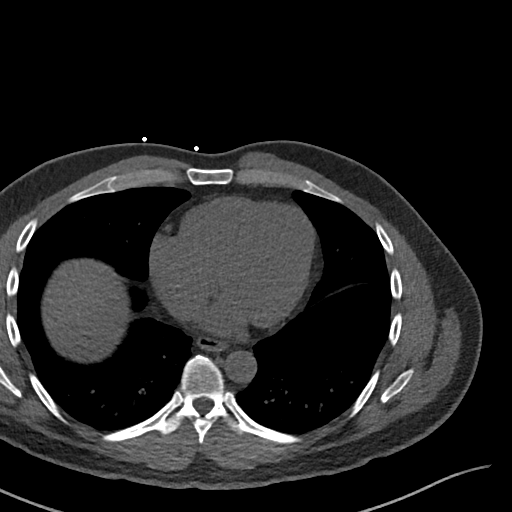
[im 22/37  vessel]
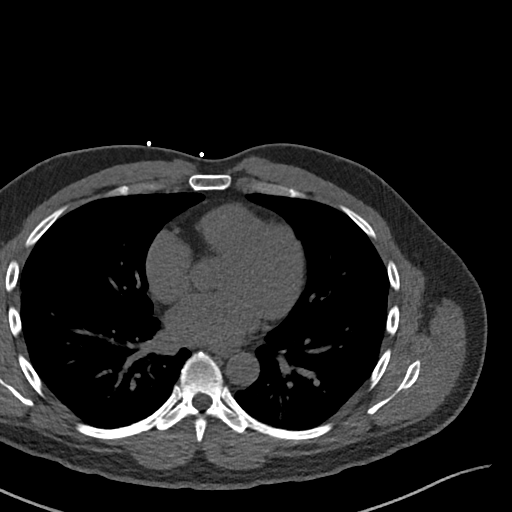
[im 29/37  vessel]
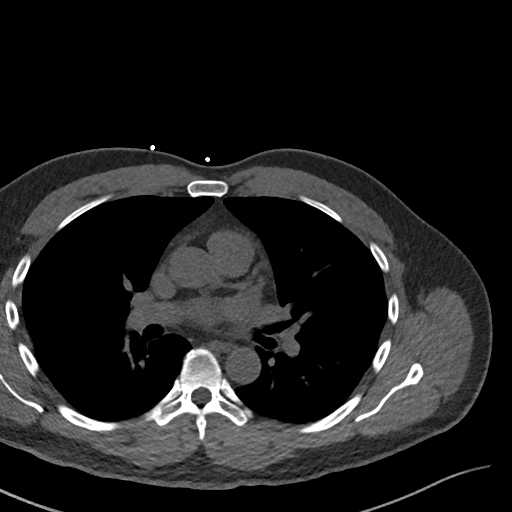

[Series 5: ax lung · axial · 0.71mm/px · z∈[+1222,+1284]mm · 4 of 37 slices shown]
[im 8/37  lung]
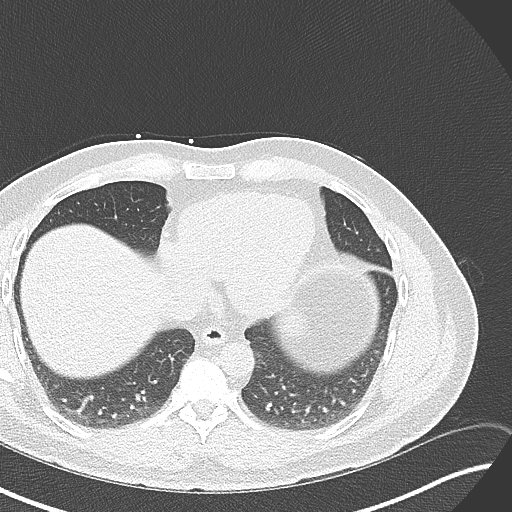
[im 15/37  lung]
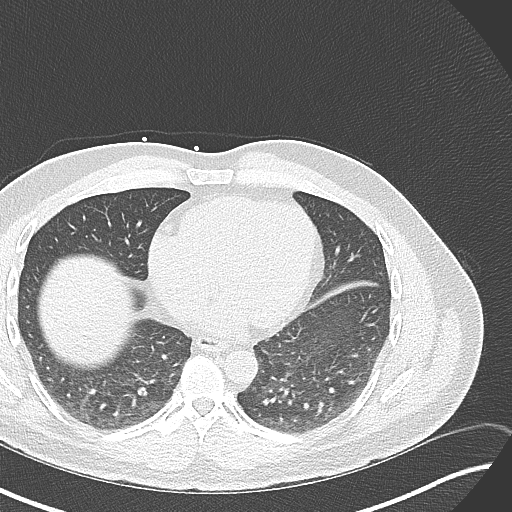
[im 22/37  lung]
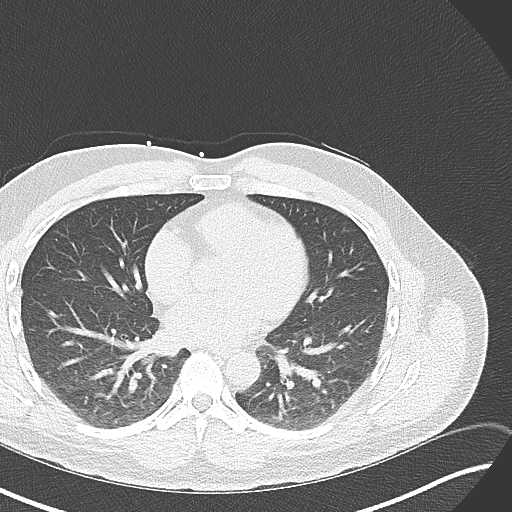
[im 29/37  lung]
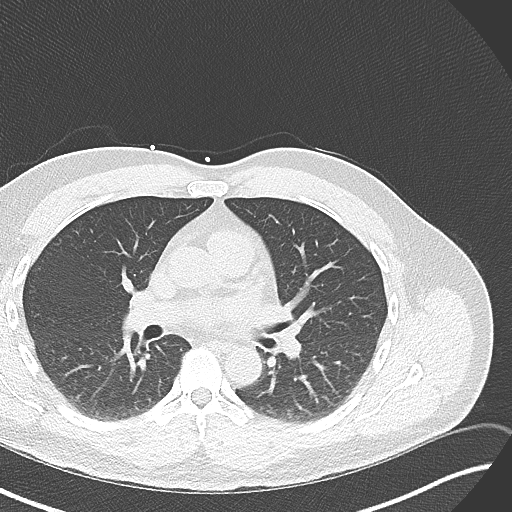

[14 of 20 positions shown; findings below may reference images not displayed]

FINDINGS: Vascular: Heart is normal size.  Aorta normal caliber.

Mediastinum/Nodes: No adenopathy

Lungs/Pleura: No confluent opacities or effusions.

Upper Abdomen: No acute findings

Musculoskeletal: Chest wall soft tissues are unremarkable. No acute
bony abnormality.
IMPRESSION: No acute or significant extracardiac abnormality.

ADDENDUM:
Cardiovascular Disease Risk stratification

EXAM:
Coronary Calcium Score
FINDINGS: Coronary arteries: Normal origins.

Coronary Calcium Score:

Left main: 0

Left anterior descending artery: 0

Left circumflex artery: 0

Right coronary artery: 0

Total: 0

Percentile: 0

Pericardium: Normal.

Ascending Aorta: Normal caliber.

Non-cardiac: See separate report from [REDACTED].
IMPRESSION: Coronary calcium score of . This was percentile for age-, race-, and
sex-matched controls.



If CAC=0, it is reasonable to withhold statin therapy and reassess
in 5 to 10 years, as long as higher risk conditions are absent
(diabetes mellitus, family history of premature CHD in first degree
relatives (males <55 years; females <65 years), cigarette smoking,
or LDL >=190 mg/dL).

If CAC is 1 to 99, it is reasonable to initiate statin therapy for
patients >=55 years of age.

If CAC is >=100 or >=75th percentile, it is reasonable to initiate
statin therapy at any age.

Cardiology referral should be considered for patients with CAC
scores >=400 or >=75th percentile.

*4369 AHA/ACC/AACVPR/AAPA/ABC/MAOKO/ANWANDTER/HERSH/Mjod/ELNAZ/GAMERITH/ALZENEIDE
Guideline on the Management of Blood Cholesterol: A Report of the
American College of Cardiology/American Heart Association Task Force
on Clinical Practice Guidelines. J Am Coll Cardiol.
8557;73(24):5314-5821.

Ijazath Appatey,DO [REDACTED]

The noncardiac portion of this study will be interpreted in separate
report by the radiologist.

*** End of Addendum ***
EXAM:
OVER-READ INTERPRETATION  CT CHEST

The following report is an over-read performed by radiologist Dr.
Zeinab Tiger [REDACTED] on 08/31/2021. This over-read
does not include interpretation of cardiac or coronary anatomy or
pathology. The coronary calcium score interpretation by the
cardiologist is attached.
FINDINGS: Vascular: Heart is normal size.  Aorta normal caliber.

Mediastinum/Nodes: No adenopathy

Lungs/Pleura: No confluent opacities or effusions.

Upper Abdomen: No acute findings

Musculoskeletal: Chest wall soft tissues are unremarkable. No acute
bony abnormality.
IMPRESSION: No acute or significant extracardiac abnormality.

## 2022-12-16 DIAGNOSIS — M545 Low back pain, unspecified: Secondary | ICD-10-CM | POA: Diagnosis not present

## 2023-08-07 DIAGNOSIS — R7301 Impaired fasting glucose: Secondary | ICD-10-CM | POA: Diagnosis not present

## 2023-08-07 DIAGNOSIS — Z0000 Encounter for general adult medical examination without abnormal findings: Secondary | ICD-10-CM | POA: Diagnosis not present

## 2023-08-07 DIAGNOSIS — Z79899 Other long term (current) drug therapy: Secondary | ICD-10-CM | POA: Diagnosis not present

## 2024-02-08 DIAGNOSIS — R7301 Impaired fasting glucose: Secondary | ICD-10-CM | POA: Diagnosis not present
